# Patient Record
Sex: Male | Born: 1985 | Race: White | Hispanic: No | Marital: Single | Smoking: Current every day smoker
Health system: Southern US, Community
[De-identification: ages and names within clinical notes are randomized; demographics above are authoritative.]

## PROBLEM LIST (undated history)

## (undated) DIAGNOSIS — S63056A Dislocation of other carpometacarpal joint of unspecified hand, initial encounter: Secondary | ICD-10-CM

## (undated) DIAGNOSIS — L237 Allergic contact dermatitis due to plants, except food: Secondary | ICD-10-CM

## (undated) DIAGNOSIS — Z9889 Other specified postprocedural states: Secondary | ICD-10-CM

## (undated) DIAGNOSIS — R112 Nausea with vomiting, unspecified: Secondary | ICD-10-CM

## (undated) HISTORY — PX: MULTIPLE TOOTH EXTRACTIONS: SHX2053

---

## 2013-10-24 ENCOUNTER — Emergency Department (HOSPITAL_COMMUNITY)
Admission: EM | Admit: 2013-10-24 | Discharge: 2013-10-24 | Disposition: A | Discharge status: Home / Self Care | Payer: Self-pay | Attending: Emergency Medicine | Admitting: Emergency Medicine

## 2013-10-24 ENCOUNTER — Emergency Department (HOSPITAL_COMMUNITY): Payer: Self-pay

## 2013-10-24 ENCOUNTER — Encounter (HOSPITAL_COMMUNITY): Payer: Self-pay | Admitting: Emergency Medicine

## 2013-10-24 DIAGNOSIS — Y929 Unspecified place or not applicable: Secondary | ICD-10-CM | POA: Insufficient documentation

## 2013-10-24 DIAGNOSIS — S62319A Displaced fracture of base of unspecified metacarpal bone, initial encounter for closed fracture: Secondary | ICD-10-CM | POA: Insufficient documentation

## 2013-10-24 DIAGNOSIS — W2209XA Striking against other stationary object, initial encounter: Secondary | ICD-10-CM | POA: Insufficient documentation

## 2013-10-24 DIAGNOSIS — F17200 Nicotine dependence, unspecified, uncomplicated: Secondary | ICD-10-CM | POA: Insufficient documentation

## 2013-10-24 DIAGNOSIS — S62309A Unspecified fracture of unspecified metacarpal bone, initial encounter for closed fracture: Secondary | ICD-10-CM

## 2013-10-24 DIAGNOSIS — Y9389 Activity, other specified: Secondary | ICD-10-CM | POA: Insufficient documentation

## 2013-10-24 MED ORDER — HYDROCODONE-ACETAMINOPHEN 5-325 MG PO TABS: 1.0000 | ORAL_TABLET | Freq: Four times a day (QID) | ORAL | Status: DC | PRN

## 2013-10-24 MED ORDER — OXYCODONE-ACETAMINOPHEN 5-325 MG PO TABS
1.0000 | ORAL_TABLET | Freq: Once | ORAL | Status: AC
  Administered 2013-10-24: 1 via ORAL
  Filled 2013-10-24: qty 1

## 2013-10-24 NOTE — Discharge Instructions (Signed)
You fractured your right third metacarpal bone. You need to be seen by a hand surgeon. Call to set up an appointment within a few days. If you develop weakness, numbness, worsened symptoms, or other concerns, seek immediate care. Keep the hand in the splint until you are seen. Take tylenol as needed for pain, and if you develop severe pain, take 1 vicodin per instructions on prescription. Do not take with tylenol, as it has tylenol in it. No more than 3g of tylenol daily.  Cast or Splint Care Casts and splints support injured limbs and keep bones from moving while they heal.  HOME CARE  Keep the cast or splint uncovered during the drying period.  A plaster cast can take 24 to 48 hours to dry.  A fiberglass cast will dry in less than 1 hour.  Do not rest the cast on anything harder than a pillow for 24 hours.  Do not put weight on your injured limb. Do not put pressure on the cast. Wait for your doctor's approval.  Keep the cast or splint dry.  Cover the cast or splint with a plastic bag during baths or wet weather.  If you have a cast over your chest and belly (trunk), take sponge baths until the cast is taken off.  If your cast gets wet, dry it with a towel or blow dryer. Use the cool setting on the blow dryer.  Keep your cast or splint clean. Wash a dirty cast with a damp cloth.  Do not put any objects under your cast or splint.  Do not scratch the skin under the cast with an object. If itching is a problem, use a blow dryer on a cool setting over the itchy area.  Do not trim or cut your cast.  Do not take out the padding from inside your cast.  Exercise your joints near the cast as told by your doctor.  Raise (elevate) your injured limb on 1 or 2 pillows for the first 1 to 3 days. GET HELP IF:  Your cast or splint cracks.  Your cast or splint is too tight or too loose.  You itch badly under the cast.  Your cast gets wet or has a soft spot.  You have a bad smell  coming from the cast.  You get an object stuck under the cast.  Your skin around the cast becomes red or sore.  You have new or more pain after the cast is put on. GET HELP RIGHT AWAY IF:  You have fluid leaking through the cast.  You cannot move your fingers or toes.  Your fingers or toes turn blue or white or are cool, painful, or puffy (swollen).  You have tingling or lose feeling (numbness) around the injured area.  You have bad pain or pressure under the cast.  You have trouble breathing or have shortness of breath.  You have chest pain. Document Released: 09/13/2010 Document Revised: 01/14/2013 Document Reviewed: 11/20/2012 [facility] Patient Information 2014 [place], [place].

## 2013-10-24 NOTE — ED Provider Notes (Signed)
I saw and evaluated the patient, reviewed the resident's note and I agree with the findings and plan.   EKG Interpretation None      Patient be discharged home at this time.  He will need hand surgery followup.  The splinted.  May require operative repair after swelling improves SPLINT APPLICATION Date/Time: 10/03/2012 3:38 PM Authorized by: Lyanne Co Consent: Verbal consent obtained. Risks and benefits: risks, benefits and alternatives were discussed Consent given by: patient Splint applied by: orthopedic technician Location details: Sugar tong  Splint type: Sugar tong  Supplies used: Ortho-Glass  Post-procedure: The splinted body part was neurovascularly unchanged following the procedure. Patient tolerance: Patient tolerated the procedure well with no immediate complications.     Lyanne Co, MD 10/24/13 [phone]
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
## 2013-10-24 NOTE — ED Notes (Signed)
Pt given specific cast/splint instructions and what to do if anything changes with hand or cast. AVS in hand. Explained thoroughly. In NAD. Ambulatory. Work note given by MD.
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
## 2013-10-24 NOTE — ED Notes (Signed)
Bed: WA19 Expected date:  Expected time:  Means of arrival:  Comments: 

## 2013-10-24 NOTE — ED Notes (Addendum)
Ortho tech at bedside. Pt aware of treatment plan.

## 2013-10-26 DIAGNOSIS — S63056A Dislocation of other carpometacarpal joint of unspecified hand, initial encounter: Secondary | ICD-10-CM

## 2013-10-26 HISTORY — DX: Dislocation of other carpometacarpal joint of unspecified hand, initial encounter: S63.056A

## 2013-10-29 ENCOUNTER — Other Ambulatory Visit: Payer: Self-pay | Admitting: Orthopedic Surgery

## 2013-10-29 ENCOUNTER — Encounter (HOSPITAL_BASED_OUTPATIENT_CLINIC_OR_DEPARTMENT_OTHER): Payer: Self-pay | Admitting: *Deleted

## 2013-10-29 DIAGNOSIS — L237 Allergic contact dermatitis due to plants, except food: Secondary | ICD-10-CM

## 2013-10-29 HISTORY — DX: Allergic contact dermatitis due to plants, except food: L23.7

## 2013-10-30 ENCOUNTER — Encounter (HOSPITAL_BASED_OUTPATIENT_CLINIC_OR_DEPARTMENT_OTHER): Payer: Self-pay | Admitting: Anesthesiology

## 2013-10-30 ENCOUNTER — Ambulatory Visit (HOSPITAL_BASED_OUTPATIENT_CLINIC_OR_DEPARTMENT_OTHER): Payer: Self-pay | Admitting: Anesthesiology

## 2013-10-30 ENCOUNTER — Encounter (HOSPITAL_BASED_OUTPATIENT_CLINIC_OR_DEPARTMENT_OTHER)
Admission: RE | Disposition: A | Discharge status: Home / Self Care | Payer: Self-pay | Source: Ambulatory Visit | Attending: Orthopedic Surgery

## 2013-10-30 ENCOUNTER — Ambulatory Visit (HOSPITAL_BASED_OUTPATIENT_CLINIC_OR_DEPARTMENT_OTHER)
Admission: RE | Admit: 2013-10-30 | Discharge: 2013-10-30 | Disposition: A | Discharge status: Home / Self Care | Payer: Self-pay | Source: Ambulatory Visit | Attending: Orthopedic Surgery | Admitting: Orthopedic Surgery

## 2013-10-30 DIAGNOSIS — S62319A Displaced fracture of base of unspecified metacarpal bone, initial encounter for closed fracture: Secondary | ICD-10-CM

## 2013-10-30 DIAGNOSIS — S62309A Unspecified fracture of unspecified metacarpal bone, initial encounter for closed fracture: Secondary | ICD-10-CM | POA: Insufficient documentation

## 2013-10-30 DIAGNOSIS — F17200 Nicotine dependence, unspecified, uncomplicated: Secondary | ICD-10-CM | POA: Insufficient documentation

## 2013-10-30 DIAGNOSIS — X58XXXA Exposure to other specified factors, initial encounter: Secondary | ICD-10-CM | POA: Insufficient documentation

## 2013-10-30 DIAGNOSIS — Y929 Unspecified place or not applicable: Secondary | ICD-10-CM | POA: Insufficient documentation

## 2013-10-30 DIAGNOSIS — S63056A Dislocation of other carpometacarpal joint of unspecified hand, initial encounter: Secondary | ICD-10-CM | POA: Insufficient documentation

## 2013-10-30 HISTORY — PX: CLOSED REDUCTION FINGER WITH PERCUTANEOUS PINNING: SHX5612

## 2013-10-30 HISTORY — DX: Allergic contact dermatitis due to plants, except food: L23.7

## 2013-10-30 HISTORY — DX: Dislocation of other carpometacarpal joint of unspecified hand, initial encounter: S63.056A

## 2013-10-30 HISTORY — DX: Other specified postprocedural states: Z98.890

## 2013-10-30 HISTORY — PX: OPEN REDUCTION INTERNAL FIXATION (ORIF) METACARPAL: SHX6234

## 2013-10-30 HISTORY — DX: Nausea with vomiting, unspecified: R11.2

## 2013-10-30 LAB — POCT HEMOGLOBIN-HEMACUE: Hemoglobin: 12.5 g/dL — ABNORMAL LOW (ref 13.0–17.0)

## 2013-10-30 SURGERY — CLOSED REDUCTION, FINGER, WITH PERCUTANEOUS PINNING
Anesthesia: Monitor Anesthesia Care | Site: Hand | Laterality: Right

## 2013-10-30 MED ORDER — FENTANYL CITRATE 0.05 MG/ML IJ SOLN
INTRAMUSCULAR | Status: AC
  Filled 2013-10-30: qty 6

## 2013-10-30 MED ORDER — LACTATED RINGERS IV SOLN
INTRAVENOUS | Status: DC | PRN
  Administered 2013-10-30: 10:00:00 via INTRAVENOUS

## 2013-10-30 MED ORDER — LIDOCAINE HCL (CARDIAC) 20 MG/ML IV SOLN
INTRAVENOUS | Status: DC | PRN
  Administered 2013-10-30: 50 mg via INTRAVENOUS

## 2013-10-30 MED ORDER — MIDAZOLAM HCL 2 MG/2ML IJ SOLN
INTRAMUSCULAR | Status: AC
  Filled 2013-10-30: qty 2

## 2013-10-30 MED ORDER — OXYCODONE HCL 5 MG PO TABS: 5.0000 mg | ORAL_TABLET | Freq: Once | ORAL | Status: DC | PRN

## 2013-10-30 MED ORDER — LACTATED RINGERS IV SOLN
INTRAVENOUS | Status: DC
  Administered 2013-10-30: 10:00:00 via INTRAVENOUS

## 2013-10-30 MED ORDER — FENTANYL CITRATE 0.05 MG/ML IJ SOLN
INTRAMUSCULAR | Status: DC | PRN
Start: 2013-10-30 — End: 2013-10-30
  Administered 2013-10-30: 50 ug via INTRAVENOUS

## 2013-10-30 MED ORDER — CHLORHEXIDINE GLUCONATE 4 % EX LIQD: 60.0000 mL | Freq: Once | CUTANEOUS | Status: DC

## 2013-10-30 MED ORDER — MIDAZOLAM HCL 5 MG/5ML IJ SOLN
INTRAMUSCULAR | Status: DC | PRN
  Administered 2013-10-30: 0.5 mg via INTRAVENOUS
  Administered 2013-10-30: 1.5 mg via INTRAVENOUS

## 2013-10-30 MED ORDER — ONDANSETRON HCL 4 MG/2ML IJ SOLN
INTRAMUSCULAR | Status: DC | PRN
  Administered 2013-10-30: 4 mg via INTRAVENOUS

## 2013-10-30 MED ORDER — PROMETHAZINE HCL 25 MG/ML IJ SOLN
6.2500 mg | INTRAMUSCULAR | Status: DC | PRN
Start: 2013-10-30 — End: 2013-10-30

## 2013-10-30 MED ORDER — PROPOFOL INFUSION 10 MG/ML OPTIME
INTRAVENOUS | Status: DC | PRN
  Administered 2013-10-30: 200 ug/kg/min via INTRAVENOUS

## 2013-10-30 MED ORDER — OXYCODONE-ACETAMINOPHEN 5-325 MG PO TABS: 1.0000 | ORAL_TABLET | ORAL | Status: DC | PRN

## 2013-10-30 MED ORDER — DEXAMETHASONE SODIUM PHOSPHATE 10 MG/ML IJ SOLN
INTRAMUSCULAR | Status: DC | PRN
  Administered 2013-10-30: 10 mg via INTRAVENOUS

## 2013-10-30 MED ORDER — MIDAZOLAM HCL 2 MG/2ML IJ SOLN
1.0000 mg | INTRAMUSCULAR | Status: DC | PRN
  Administered 2013-10-30: 2 mg via INTRAVENOUS

## 2013-10-30 MED ORDER — FENTANYL CITRATE 0.05 MG/ML IJ SOLN
50.0000 ug | INTRAMUSCULAR | Status: DC | PRN
  Administered 2013-10-30: 100 ug via INTRAVENOUS

## 2013-10-30 MED ORDER — CEFAZOLIN SODIUM-DEXTROSE 2-3 GM-% IV SOLR
2.0000 g | INTRAVENOUS | Status: AC
Start: 2013-10-30 — End: 2013-10-30
  Administered 2013-10-30: 2 g via INTRAVENOUS

## 2013-10-30 MED ORDER — HYDROMORPHONE HCL PF 1 MG/ML IJ SOLN
0.2500 mg | INTRAMUSCULAR | Status: DC | PRN
Start: 2013-10-30 — End: 2013-10-30

## 2013-10-30 MED ORDER — MIDAZOLAM HCL 2 MG/ML PO SYRP: 12.0000 mg | ORAL_SOLUTION | Freq: Once | ORAL | Status: DC | PRN

## 2013-10-30 MED ORDER — PROPOFOL INFUSION 10 MG/ML OPTIME
INTRAVENOUS | Status: DC | PRN
  Administered 2013-10-30: 40 mL via INTRAVENOUS

## 2013-10-30 MED ORDER — BUPIVACAINE-EPINEPHRINE (PF) 0.5% -1:200000 IJ SOLN
INTRAMUSCULAR | Status: DC | PRN
  Administered 2013-10-30: 30 mL

## 2013-10-30 MED ORDER — OXYCODONE HCL 5 MG/5ML PO SOLN: 5.0000 mg | Freq: Once | ORAL | Status: DC | PRN

## 2013-10-30 MED ORDER — DEXAMETHASONE SODIUM PHOSPHATE 10 MG/ML IJ SOLN
INTRAMUSCULAR | Status: DC | PRN
  Administered 2013-10-30: 6 mg

## 2013-10-30 SURGICAL SUPPLY — 67 items
APL SKNCLS STERI-STRIP NONHPOA (GAUZE/BANDAGES/DRESSINGS)
BANDAGE ELASTIC 3 VELCRO ST LF (GAUZE/BANDAGES/DRESSINGS) ×3 IMPLANT
BANDAGE ELASTIC 4 VELCRO ST LF (GAUZE/BANDAGES/DRESSINGS) IMPLANT
BENZOIN TINCTURE PRP APPL 2/3 (GAUZE/BANDAGES/DRESSINGS) IMPLANT
BIT DRILL 1.5 (BIT) ×1 IMPLANT
BLADE 15 SAFETY STRL DISP (BLADE) ×3 IMPLANT
BNDG CMPR 9X4 STRL LF SNTH (GAUZE/BANDAGES/DRESSINGS)
BNDG CMPR MD 5X2 ELC HKLP STRL (GAUZE/BANDAGES/DRESSINGS)
BNDG COHESIVE 1X5 TAN STRL LF (GAUZE/BANDAGES/DRESSINGS) IMPLANT
BNDG ELASTIC 2 VLCR STRL LF (GAUZE/BANDAGES/DRESSINGS) IMPLANT
BNDG ESMARK 4X9 LF (GAUZE/BANDAGES/DRESSINGS) IMPLANT
BNDG GAUZE ELAST 4 BULKY (GAUZE/BANDAGES/DRESSINGS) IMPLANT
CANISTER SUCT 1200ML W/VALVE (MISCELLANEOUS) IMPLANT
CLOSURE WOUND 1/2 X4 (GAUZE/BANDAGES/DRESSINGS)
CORDS BIPOLAR (ELECTRODE) IMPLANT
COVER TABLE BACK 60X90 (DRAPES) ×3 IMPLANT
CUFF TOURNIQUET SINGLE 18IN (TOURNIQUET CUFF) ×3 IMPLANT
DECANTER SPIKE VIAL GLASS SM (MISCELLANEOUS) IMPLANT
DRAPE EXTREMITY T 121X128X90 (DRAPE) ×3 IMPLANT
DRAPE OEC MINIVIEW 54X84 (DRAPES) ×3 IMPLANT
DRAPE SURG 17X23 STRL (DRAPES) ×3 IMPLANT
DRILL BIT 1.5 (BIT) ×3
DURAPREP 26ML APPLICATOR (WOUND CARE) ×3 IMPLANT
GAUZE SPONGE 4X4 12PLY STRL (GAUZE/BANDAGES/DRESSINGS) ×3 IMPLANT
GAUZE SPONGE 4X4 16PLY XRAY LF (GAUZE/BANDAGES/DRESSINGS) IMPLANT
GAUZE XEROFORM 1X8 LF (GAUZE/BANDAGES/DRESSINGS) IMPLANT
GLOVE BIOGEL M STRL SZ7.5 (GLOVE) ×3 IMPLANT
GLOVE BIOGEL PI IND STRL 7.5 (GLOVE) ×1 IMPLANT
GLOVE BIOGEL PI INDICATOR 7.5 (GLOVE) ×2
GLOVE SURG SS PI 7.5 STRL IVOR (GLOVE) ×3 IMPLANT
GLOVE SURG SYN 8.0 (GLOVE) ×6 IMPLANT
GOWN STRL REUS W/ TWL LRG LVL3 (GOWN DISPOSABLE) ×1 IMPLANT
GOWN STRL REUS W/TWL LRG LVL3 (GOWN DISPOSABLE) ×3
GOWN STRL REUS W/TWL XL LVL3 (GOWN DISPOSABLE) ×6 IMPLANT
K-WIRE .045X4 (WIRE) ×3 IMPLANT
K-WIRE .062X4 (WIRE) ×6 IMPLANT
NEEDLE HYPO 25X1 1.5 SAFETY (NEEDLE) IMPLANT
NS IRRIG 1000ML POUR BTL (IV SOLUTION) ×3 IMPLANT
PACK BASIN DAY SURGERY FS (CUSTOM PROCEDURE TRAY) ×3 IMPLANT
PAD CAST 3X4 CTTN HI CHSV (CAST SUPPLIES) ×1 IMPLANT
PAD CAST 4YDX4 CTTN HI CHSV (CAST SUPPLIES) IMPLANT
PADDING CAST ABS 4INX4YD NS (CAST SUPPLIES) ×2
PADDING CAST ABS COTTON 4X4 ST (CAST SUPPLIES) ×1 IMPLANT
PADDING CAST COTTON 3X4 STRL (CAST SUPPLIES) ×3
PADDING CAST COTTON 4X4 STRL (CAST SUPPLIES)
PADDING UNDERCAST 2 STRL (CAST SUPPLIES) ×2
PADDING UNDERCAST 2X4 STRL (CAST SUPPLIES) ×1 IMPLANT
SCREW CORTEX SLFTPNG 20MM (Screw) ×3 IMPLANT
SHEET MEDIUM DRAPE 40X70 STRL (DRAPES) ×3 IMPLANT
SPLINT PLASTER CAST XFAST 4X15 (CAST SUPPLIES) ×15 IMPLANT
SPLINT PLASTER XTRA FAST SET 4 (CAST SUPPLIES) ×30
STOCKINETTE 4X48 STRL (DRAPES) ×3 IMPLANT
STRIP CLOSURE SKIN 1/2X4 (GAUZE/BANDAGES/DRESSINGS) IMPLANT
SUCTION FRAZIER TIP 10 FR DISP (SUCTIONS) IMPLANT
SUT ETHILON 4 0 PS 2 18 (SUTURE) IMPLANT
SUT ETHILON 5 0 PS 2 18 (SUTURE) IMPLANT
SUT MERSILENE 4 0 P 3 (SUTURE) IMPLANT
SUT VIC AB 4-0 P-3 18XBRD (SUTURE) ×1 IMPLANT
SUT VIC AB 4-0 P3 18 (SUTURE) ×3
SUT VICRYL RAPIDE 4-0 (SUTURE) ×3 IMPLANT
SUT VICRYL RAPIDE 4/0 PS 2 (SUTURE) IMPLANT
SYR BULB 3OZ (MISCELLANEOUS) ×3 IMPLANT
SYRINGE 10CC LL (SYRINGE) IMPLANT
TOWEL OR 17X24 6PK STRL BLUE (TOWEL DISPOSABLE) ×3 IMPLANT
TUBE CONNECTING 20'X1/4 (TUBING)
TUBE CONNECTING 20X1/4 (TUBING) IMPLANT
UNDERPAD 30X30 INCONTINENT (UNDERPADS AND DIAPERS) ×3 IMPLANT

## 2013-10-30 NOTE — H&P (Signed)
Ronald Pace is an 28 y.o. male.   Chief Complaint: right hand pain and swelling HPI: as above s/p right hand trauma with XR positive for [facility] fx/dx  Past Medical History  Diagnosis Date  . PONV (postoperative nausea and vomiting)   . CMC (carpometacarpal joint) dislocation 10/2013    right hand  . Poison ivy 10/29/2013    right hand    Past Surgical History  Procedure Laterality Date  . Multiple tooth extractions      x 4 - as a child    History reviewed. No pertinent family history. Social History:  reports that he has been smoking Cigars.  He has never used smokeless tobacco. He reports that he drinks alcohol. He reports that he does not use illicit drugs.  Allergies: No Known Allergies  No prescriptions prior to admission    No results found for this or any previous visit (from the past 48 hour(s)). No results found.  Review of Systems  All other systems reviewed and are negative.   Height 5\' 11"  (1.803 m), weight 77.111 kg (170 lb). Physical Exam  Constitutional: He is oriented to person, place, and time. He appears well-developed and well-nourished.  HENT:  Head: Normocephalic and atraumatic.  Cardiovascular: Normal rate.   Respiratory: Effort normal.  Musculoskeletal:       Right hand: He exhibits tenderness, bony tenderness and swelling.  Right hand CMC fracture/dislocations  Neurological: He is alert and oriented to person, place, and time.  Skin: Skin is warm.  Psychiatric: He has a normal mood and affect. His behavior is normal. Judgment and thought content normal.     Assessment/Plan As above  Plan CRPP   Artist Pais [facility] 10/30/2013, 6:58 AM

## 2013-10-30 NOTE — Discharge Instructions (Signed)
Regional Anesthesia Blocks ° °1. Numbness or the inability to move the "blocked" extremity may last from 3-48 hours after placement. The length of time depends on the medication injected and your individual response to the medication. If the numbness is not going away after 48 hours, call your surgeon. ° °2. The extremity that is blocked will need to be protected until the numbness is gone and the  Strength has returned. Because you cannot feel it, you will need to take extra care to avoid injury. Because it may be weak, you may have difficulty moving it or using it. You may not know what position it is in without looking at it while the block is in effect. ° °3. For blocks in the legs and feet, returning to weight bearing and walking needs to be done carefully. You will need to wait until the numbness is entirely gone and the strength has returned. You should be able to move your leg and foot normally before you try and bear weight or walk. You will need someone to be with you when you first try to ensure you do not fall and possibly risk injury. ° °4. Bruising and tenderness at the needle site are common side effects and will resolve in a few days. ° °5. Persistent numbness or new problems with movement should be communicated to the surgeon or the [facility] ([phone])/ [facility] ([phone]). ° ° ° °Post Anesthesia Home Care Instructions ° °Activity: °Get plenty of rest for the remainder of the day. A responsible adult should stay with you for 24 hours following the procedure.  °For the next 24 hours, DO NOT: °-Drive a car °-Operate machinery °-Drink alcoholic beverages °-Take any medication unless instructed by your physician °-Make any legal decisions or sign important papers. ° °Meals: °Start with liquid foods such as gelatin or soup. Progress to regular foods as tolerated. Avoid greasy, spicy, heavy foods. If nausea and/or vomiting occur, drink only clear liquids until the  nausea and/or vomiting subsides. Call your physician if vomiting continues. ° °Special Instructions/Symptoms: °Your throat may feel dry or sore from the anesthesia or the breathing tube placed in your throat during surgery. If this causes discomfort, gargle with warm salt water. The discomfort should disappear within 24 hours. ° °

## 2013-10-30 NOTE — Transfer of Care (Signed)
Immediate Anesthesia Transfer of Care Note  Patient: Ronald Pace  Procedure(s) Performed: Procedure(s): CLOSED REDUCTION  PERCUTANEOUS PINNING RIGHT HAND CARPAL METACARPAL DISLOCATION /FRACTURE  (Right)  Patient Location: PACU  Anesthesia Type:MAC and Regional  Level of Consciousness: awake, alert , oriented and patient cooperative  Airway & Oxygen Therapy: Patient Spontanous Breathing and Patient connected to nasal cannula oxygen  Post-op Assessment: Report given to PACU RN and Post -op Vital signs reviewed and stable  Post vital signs: Reviewed and stable  Complications: No apparent anesthesia complications

## 2013-10-30 NOTE — Anesthesia Preprocedure Evaluation (Addendum)
Anesthesia Evaluation  Patient identified by MRN, date of birth, ID band Patient awake    Reviewed: Allergy & Precautions, H&P , NPO status , Patient's Chart, lab work & pertinent test results  History of Anesthesia Complications (+) PONV and history of anesthetic complications  Airway Mallampati: I TM Distance: >3 FB Neck ROM: Full    Dental  (+) Teeth Intact, Dental Advisory Given   Pulmonary Current Smoker,    Pulmonary exam normal       Cardiovascular Exercise Tolerance: Good negative cardio ROS  Rhythm:Regular Rate:Normal     Neuro/Psych negative neurological ROS  negative psych ROS   GI/Hepatic negative GI ROS, Neg liver ROS,   Endo/Other  negative endocrine ROS  Renal/GU negative Renal ROS  negative genitourinary   Musculoskeletal negative musculoskeletal ROS (+)   Abdominal Normal abdominal exam  (+)   Peds negative pediatric ROS (+)  Hematology negative hematology ROS (+)   Anesthesia Other Findings   Reproductive/Obstetrics negative OB ROS                      Anesthesia Physical Anesthesia Plan  ASA: II  Anesthesia Plan: MAC and Regional   Post-op Pain Management:    Induction: Intravenous  Airway Management Planned: Nasal Cannula  Additional Equipment:   Intra-op Plan:   Post-operative Plan: Extubation in OR  Informed Consent: I have reviewed the patients History and Physical, chart, labs and discussed the procedure including the risks, benefits and alternatives for the proposed anesthesia with the patient or authorized representative who has indicated his/her understanding and acceptance.   Dental advisory given  Plan Discussed with: CRNA, Anesthesiologist and Surgeon  Anesthesia Plan Comments:     Anesthesia Quick Evaluation

## 2013-10-30 NOTE — Progress Notes (Signed)
Assisted Dr. Singer with right, ultrasound guided, supraclavicular block. Side rails up, monitors on throughout procedure. See vital signs in flow sheet. Tolerated Procedure well. 

## 2013-10-30 NOTE — Anesthesia Procedure Notes (Addendum)
Anesthesia Regional Block:  Supraclavicular block  Pre-Anesthetic Checklist: ,, timeout performed, Correct Patient, Correct Site, Correct Laterality, Correct Procedure, Correct Position, site marked, Risks and benefits discussed,  Surgical consent,  Pre-op evaluation,  At surgeon's request and post-op pain management  Laterality: Right  Prep: chloraprep       Needles:  Injection technique: Single-shot  Needle Type: Echogenic Stimulator Needle     Needle Length: 5cm 5 cm Needle Gauge: 22 and 22 G    Additional Needles:  Procedures: ultrasound guided (picture in chart) and nerve stimulator Supraclavicular block  Nerve Stimulator or Paresthesia:  Response: bicep contraction, 0.48 mA,   Additional Responses:   Narrative:  Start time: 10/30/2013 10:41 AM End time: 10/30/2013 10:51 AM Injection made incrementally with aspirations every 5 mL.  Performed by: Personally   Additional Notes: Functioning IV was confirmed and monitors applied.  A 71mm 22ga echogenic arrow stimulator was used. Sterile prep and drape,hand hygiene and sterile gloves were used.Ultrasound guidance: relevent anatomy identified, needle position confirmed, local anesthetic spread visualized around nerve(s)., vascular puncture avoided.  Image printed for medical record.  Negative aspiration and negative test dose prior to incremental administration of local anesthetic. The patient tolerated the procedure well.   Procedure Name: MAC Date/Time: 10/30/2013 11:32 AM Performed by: Tyrone Nine Pre-anesthesia Checklist: Patient identified, Timeout performed, Emergency Drugs available, Suction available and Patient being monitored Patient Re-evaluated:Patient Re-evaluated prior to inductionOxygen Delivery Method: Nasal cannula Placement Confirmation: CO2 detector

## 2013-10-30 NOTE — Anesthesia Postprocedure Evaluation (Signed)
Anesthesia Post Note  Patient: Ronald Pace  Procedure(s) Performed: Procedure(s) (LRB): CLOSED REDUCTION  PERCUTANEOUS PINNING RIGHT HAND CARPAL METACARPAL DISLOCATION /FRACTURE  (Right) OPEN REDUCTION INTERNAL FIXATION (ORIF) RIGHT THIRD METACARPAL (Right)  Anesthesia type: MAC  Patient location: PACU  Post pain: Pain level controlled  Post assessment: Patient's Cardiovascular Status Stable  Last Vitals:  Filed Vitals:   10/30/13 1345  BP: 118/55  Pulse: 45  Temp: 36.7 C  Resp: 16    Post vital signs: Reviewed and stable  Level of consciousness: sedated  Complications: No apparent anesthesia complications

## 2013-10-30 NOTE — Op Note (Signed)
See note [phone]

## 2013-11-02 ENCOUNTER — Encounter (HOSPITAL_BASED_OUTPATIENT_CLINIC_OR_DEPARTMENT_OTHER): Payer: Self-pay | Admitting: Orthopedic Surgery

## 2013-11-02 NOTE — Op Note (Signed)
NAME:  Ronald Pace, Ronald Pace NO.:  [id number]  MEDICAL RECORD NO.:  [id number]  LOCATION:                               FACILITY:  [facility]  PHYSICIAN:  Artist Pais. Marcellino Fidalgo, M.D.DATE OF BIRTH:  12/24/1985  DATE OF PROCEDURE:  10/30/2013 DATE OF DISCHARGE:  10/30/2013                              OPERATIVE REPORT   PREOPERATIVE DIAGNOSIS:  Displaced right long finger carpometacarpal joint fracture dislocation.  POSTOPERATIVE DIAGNOSIS:  Displaced right long finger carpometacarpal joint fracture dislocation.  PROCEDURE:  Closed reduction and percutaneous pinning of CMC dislocation with open reduction and internal fixation of metacarpal intra-articular fracture with 0.062 K-wires to the joint and a 20 x 2.0-mm modular handset screws.  SURGEON:  Artist Pais. Mina Marble, M.D.  ASSISTANT:  Jonni Sanger, P.A.  ANESTHESIA:  Axillary block and sedation.  No complication.  No drains.  DESCRIPTION OF PROCEDURE:  The patient was taken to the operating suite after induction of adequate supraclavicular analgesia and IV sedation. Right upper extremity was prepped and draped in usual sterile fashion. An Esmarch was used to exsanguinate the limb.  Tourniquet was inflated to 250 mmHg.  At this point in time, longitudinal traction downward pressure was placed on the metacarpal base of long finger.  CMC joint was reduced and pinned with 0.062 K-wires x2 from distal to proximal and from dorsal to volar.  There was a large fragment that was palpable under the skin at the insertion of extensor carpi radialis longus and brevis tendons.  We made an incision over this and dissected the fracture fragment free and put it back on to its bed at the base of the long metacarpal and fixed it with a single 2.0-mm screw 20 mm in length. Intraoperative fluoroscopy revealed adequate reduction in AP, lateral, and oblique view.  The wound was thoroughly irrigated, and closed in layers of 4-0  Vicryl and 4-0 Vicryl Rapide subcuticular stitch on the skin.  Steri-Strips, 4x4s, fluffs, and a splint was applied volarly with the wrist in extension.  The patient tolerated the procedure well and went to the recovery room in a stable fashion.    Artist Pais Mina Marble, M.D.    MAW/MEDQ  D:  10/30/2013  T:  10/31/2013  Job:  [id number]

## 2013-11-05 ENCOUNTER — Ambulatory Visit: Payer: Self-pay | Attending: Orthopedic Surgery | Admitting: Occupational Therapy

## 2013-11-05 DIAGNOSIS — M25649 Stiffness of unspecified hand, not elsewhere classified: Secondary | ICD-10-CM | POA: Insufficient documentation

## 2013-11-05 DIAGNOSIS — M25549 Pain in joints of unspecified hand: Secondary | ICD-10-CM | POA: Insufficient documentation

## 2013-11-05 DIAGNOSIS — IMO0001 Reserved for inherently not codable concepts without codable children: Secondary | ICD-10-CM | POA: Insufficient documentation

## 2013-11-06 ENCOUNTER — Encounter (HOSPITAL_COMMUNITY): Payer: Self-pay | Admitting: Emergency Medicine

## 2013-11-06 DIAGNOSIS — Z872 Personal history of diseases of the skin and subcutaneous tissue: Secondary | ICD-10-CM | POA: Insufficient documentation

## 2013-11-06 DIAGNOSIS — Z87828 Personal history of other (healed) physical injury and trauma: Secondary | ICD-10-CM | POA: Insufficient documentation

## 2013-11-06 DIAGNOSIS — F17200 Nicotine dependence, unspecified, uncomplicated: Secondary | ICD-10-CM | POA: Insufficient documentation

## 2013-11-06 DIAGNOSIS — Y92410 Unspecified street and highway as the place of occurrence of the external cause: Secondary | ICD-10-CM | POA: Insufficient documentation

## 2013-11-06 DIAGNOSIS — S92109A Unspecified fracture of unspecified talus, initial encounter for closed fracture: Secondary | ICD-10-CM | POA: Insufficient documentation

## 2013-11-06 DIAGNOSIS — Y9389 Activity, other specified: Secondary | ICD-10-CM | POA: Insufficient documentation
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
## 2013-11-07 ENCOUNTER — Emergency Department (HOSPITAL_COMMUNITY): Payer: Self-pay

## 2013-11-07 ENCOUNTER — Emergency Department (HOSPITAL_COMMUNITY)
Admission: EM | Admit: 2013-11-07 | Discharge: 2013-11-07 | Disposition: A | Discharge status: Home / Self Care | Payer: Self-pay | Attending: Emergency Medicine | Admitting: Emergency Medicine

## 2013-11-07 DIAGNOSIS — S92101A Unspecified fracture of right talus, initial encounter for closed fracture: Secondary | ICD-10-CM

## 2013-11-07 MED ORDER — FENTANYL CITRATE 0.05 MG/ML IJ SOLN
50.0000 ug | Freq: Once | INTRAMUSCULAR | Status: AC
  Administered 2013-11-07: 50 ug via INTRAVENOUS
  Filled 2013-11-07: qty 2

## 2013-11-07 MED ORDER — OXYCODONE-ACETAMINOPHEN 5-325 MG PO TABS
2.0000 | ORAL_TABLET | Freq: Once | ORAL | Status: AC
  Administered 2013-11-07: 2 via ORAL
  Filled 2013-11-07: qty 2

## 2013-11-07 MED ORDER — MORPHINE SULFATE 4 MG/ML IJ SOLN
4.0000 mg | Freq: Once | INTRAMUSCULAR | Status: AC
  Administered 2013-11-07: 4 mg via INTRAVENOUS
  Filled 2013-11-07: qty 1

## 2013-11-07 MED ORDER — OXYCODONE-ACETAMINOPHEN 5-325 MG PO TABS: 1.0000 | ORAL_TABLET | ORAL | Status: AC | PRN

## 2013-11-07 MED ORDER — IBUPROFEN 600 MG PO TABS: 600.0000 mg | ORAL_TABLET | Freq: Four times a day (QID) | ORAL | Status: AC | PRN

## 2013-11-07 MED ORDER — FENTANYL CITRATE 0.05 MG/ML IJ SOLN
50.0000 ug | Freq: Once | INTRAMUSCULAR | Status: AC
Start: 2013-11-07 — End: 2013-11-07
  Administered 2013-11-07: 50 ug via INTRAVENOUS

## 2013-11-07 NOTE — ED Notes (Signed)
Reported to Dr. [name] that the patient has received his 2nd dose of fentanyl, and his x-ray results are back.  No new orders received at this time.

## 2013-11-07 NOTE — ED Notes (Signed)
Ortho paged. Portable CXR at the bedside.

## 2013-11-07 NOTE — ED Notes (Signed)
Patient reports he ran out of percocet yesterday for right arm pain. No pain medicine today.

## 2013-11-07 NOTE — ED Notes (Signed)
Ultrasound at the bedside

## 2013-11-07 NOTE — ED Notes (Signed)
Splint in place.
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
## 2013-11-07 NOTE — ED Notes (Signed)
Reported pain level to Dr. [name]. MD gives verbal order for 4mg  of morphine.

## 2013-11-07 NOTE — ED Notes (Signed)
Applied cool damp washcloth to right ankle for comfort.

## 2013-11-07 NOTE — ED Notes (Signed)
Patient report's his heart rate is usually in the 40's, and that he is athletic.

## 2013-11-07 NOTE — ED Notes (Signed)
Dr. Manly at the bedside.  

## 2013-11-07 NOTE — ED Notes (Signed)
Patient refused to wear ice. He reports the weight of the ice hurts his foot.

## 2013-11-07 NOTE — Progress Notes (Signed)
Orthopedic Tech Progress Note Patient Details:  Ronald FellsWilliam C Pace [birth year] [id number]  Ortho Devices Type of Ortho Device: Post splint;Stirrup splint Splint Material: Fiberglass Ortho Device/Splint Location: short leg posterior splint with a stirrup is applied to the right lower extremity. the patient stated he was in a lot of pain. splint care is gone verbally with patient he states he understands and will contact the nursing staff with any further questions.   Early CharsBaker,[name] 11/07/2013, 4:30 AM

## 2013-11-07 NOTE — ED Notes (Signed)
Alinda Moneyony from Ortho is coming to place splint.

## 2013-11-07 NOTE — Discharge Instructions (Signed)
PLEASE ELEVATE AND ICE YOUR RIGHT ANKLE AS FREQUENTLY AS POSSIBLE OVER THE NEXT FEW DAYS.   SCHEDULE AN APPT ON Monday MORNING WITH DR. Marland Kitchen.LANDAU, THE ORTHOPEDIST ON CALL.   RETURN TO THE ED FOR SEVERE, UNCONTROLLED PAIN OR NUMBNESS OF THE RIGHT ANKLE/FOOT.

## 2013-11-07 NOTE — ED Notes (Signed)
Patient reports he last ate yogurt 2230 last night.

## 2013-11-23 ENCOUNTER — Ambulatory Visit: Payer: Self-pay | Admitting: Occupational Therapy
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
